# Patient Record
Sex: Female | Born: 1985 | Race: White | Hispanic: No | Marital: Married | Smoking: Former smoker
Health system: Southern US, Community
[De-identification: ages and names within clinical notes are randomized; demographics above are authoritative.]

## PROBLEM LIST (undated history)

## (undated) DIAGNOSIS — F319 Bipolar disorder, unspecified: Secondary | ICD-10-CM

## (undated) DIAGNOSIS — F411 Generalized anxiety disorder: Secondary | ICD-10-CM

## (undated) DIAGNOSIS — K589 Irritable bowel syndrome without diarrhea: Secondary | ICD-10-CM

## (undated) HISTORY — DX: Irritable bowel syndrome, unspecified: K58.9

## (undated) HISTORY — DX: Generalized anxiety disorder: F41.1

## (undated) HISTORY — PX: LAPAROSCOPY: SHX197

## (undated) HISTORY — DX: Bipolar disorder, unspecified: F31.9

## (undated) HISTORY — PX: CYSTOSCOPY WITH HYDRODISTENSION AND BIOPSY: SHX5127

## (undated) HISTORY — PX: CYSTOSCOPY: SUR368

---
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
## 2005-05-17 ENCOUNTER — Other Ambulatory Visit: Admission: RE | Admit: 2005-05-17 | Discharge: 2005-05-17 | Payer: Self-pay | Admitting: Obstetrics and Gynecology

## 2005-06-10 ENCOUNTER — Ambulatory Visit (HOSPITAL_COMMUNITY): Admission: RE | Admit: 2005-06-10 | Discharge: 2005-06-10 | Payer: Self-pay | Admitting: Obstetrics and Gynecology

## 2005-06-10 ENCOUNTER — Encounter (INDEPENDENT_AMBULATORY_CARE_PROVIDER_SITE_OTHER): Payer: Self-pay | Admitting: *Deleted

## 2006-06-30 ENCOUNTER — Other Ambulatory Visit: Admission: RE | Admit: 2006-06-30 | Discharge: 2006-06-30 | Payer: Self-pay | Admitting: Obstetrics and Gynecology

## 2006-10-28 ENCOUNTER — Ambulatory Visit: Payer: Self-pay | Admitting: Family Medicine

## 2006-10-28 DIAGNOSIS — F17200 Nicotine dependence, unspecified, uncomplicated: Secondary | ICD-10-CM | POA: Insufficient documentation

## 2006-10-28 DIAGNOSIS — G4700 Insomnia, unspecified: Secondary | ICD-10-CM | POA: Insufficient documentation

## 2006-10-28 DIAGNOSIS — F411 Generalized anxiety disorder: Secondary | ICD-10-CM | POA: Insufficient documentation

## 2006-12-02 ENCOUNTER — Ambulatory Visit: Payer: Self-pay | Admitting: Family Medicine

## 2007-02-03 ENCOUNTER — Ambulatory Visit: Payer: Self-pay | Admitting: Family Medicine

## 2007-02-03 DIAGNOSIS — IMO0001 Reserved for inherently not codable concepts without codable children: Secondary | ICD-10-CM | POA: Insufficient documentation

## 2007-02-04 ENCOUNTER — Encounter: Payer: Self-pay | Admitting: Family Medicine

## 2007-02-06 ENCOUNTER — Encounter: Payer: Self-pay | Admitting: Family Medicine

## 2007-02-06 LAB — CONVERTED CEMR LAB
AST: 18 units/L (ref 0–37)
Alkaline Phosphatase: 48 units/L (ref 39–117)
BUN: 9 mg/dL (ref 6–23)
Basophils Absolute: 0 10*3/uL (ref 0.0–0.1)
Basophils Relative: 0 % (ref 0–1)
Calcium: 9.3 mg/dL (ref 8.4–10.5)
Chloride: 107 meq/L (ref 96–112)
Creatinine, Ser: 0.59 mg/dL (ref 0.40–1.20)
Eosinophils Relative: 2 % (ref 0–5)
Folate: >20 ng/mL
HCT: 41.1 % (ref 36.0–46.0)
Hemoglobin: 13.1 g/dL (ref 12.0–15.0)
MCHC: 31.9 g/dL (ref 30.0–36.0)
Monocytes Absolute: 0.6 10*3/uL (ref 0.2–0.7)
RDW: 13.1 % (ref 11.5–14.0)
Total CK: 49 units/L (ref 7–177)
Vitamin B-12: 408 pg/mL (ref 211–911)

## 2007-02-17 ENCOUNTER — Ambulatory Visit: Payer: Self-pay | Admitting: Family Medicine

## 2007-03-14 ENCOUNTER — Ambulatory Visit: Payer: Self-pay | Admitting: Family Medicine

## 2007-03-15 ENCOUNTER — Encounter: Payer: Self-pay | Admitting: Family Medicine

## 2007-03-31 ENCOUNTER — Ambulatory Visit: Payer: Self-pay | Admitting: Family Medicine

## 2007-03-31 DIAGNOSIS — R109 Unspecified abdominal pain: Secondary | ICD-10-CM | POA: Insufficient documentation

## 2007-04-03 ENCOUNTER — Encounter: Payer: Self-pay | Admitting: Family Medicine

## 2007-05-04 ENCOUNTER — Ambulatory Visit: Payer: Self-pay | Admitting: Family Medicine

## 2007-05-04 DIAGNOSIS — K589 Irritable bowel syndrome without diarrhea: Secondary | ICD-10-CM | POA: Insufficient documentation

## 2007-05-10 ENCOUNTER — Telehealth: Payer: Self-pay | Admitting: Family Medicine

## 2007-05-22 ENCOUNTER — Encounter: Payer: Self-pay | Admitting: Family Medicine

## 2007-06-06 ENCOUNTER — Ambulatory Visit: Payer: Self-pay | Admitting: Family Medicine

## 2007-06-09 ENCOUNTER — Telehealth: Payer: Self-pay | Admitting: Family Medicine

## 2007-06-12 ENCOUNTER — Encounter: Payer: Self-pay | Admitting: Family Medicine

## 2007-06-26 ENCOUNTER — Telehealth: Payer: Self-pay | Admitting: Family Medicine

## 2007-06-27 ENCOUNTER — Telehealth: Payer: Self-pay | Admitting: Family Medicine

## 2007-06-29 ENCOUNTER — Encounter: Payer: Self-pay | Admitting: Family Medicine

## 2007-07-03 ENCOUNTER — Ambulatory Visit: Payer: Self-pay | Admitting: Family Medicine

## 2007-07-05 ENCOUNTER — Encounter: Payer: Self-pay | Admitting: Family Medicine

## 2007-07-19 ENCOUNTER — Telehealth: Payer: Self-pay | Admitting: Family Medicine

## 2007-08-31 HISTORY — PX: OTHER SURGICAL HISTORY: SHX169

## 2007-08-31 HISTORY — PX: CHOLECYSTECTOMY: SHX55

## 2007-10-03 ENCOUNTER — Ambulatory Visit: Payer: Self-pay | Admitting: Family Medicine

## 2007-10-09 ENCOUNTER — Telehealth: Payer: Self-pay | Admitting: Family Medicine

## 2007-10-23 ENCOUNTER — Telehealth: Payer: Self-pay | Admitting: Family Medicine

## 2007-12-18 ENCOUNTER — Ambulatory Visit: Payer: Self-pay | Admitting: Family Medicine

## 2007-12-18 DIAGNOSIS — R209 Unspecified disturbances of skin sensation: Secondary | ICD-10-CM | POA: Insufficient documentation

## 2007-12-19 LAB — CONVERTED CEMR LAB
Alkaline Phosphatase: 69 units/L (ref 39–117)
BUN: 9 mg/dL (ref 6–23)
Creatinine, Ser: 0.54 mg/dL (ref 0.40–1.20)
Glucose, Bld: 80 mg/dL (ref 70–99)
Sodium: 141 meq/L (ref 135–145)
Total Bilirubin: 0.5 mg/dL (ref 0.3–1.2)
Total Protein: 7.2 g/dL (ref 6.0–8.3)

## 2008-03-29 ENCOUNTER — Other Ambulatory Visit: Admission: RE | Admit: 2008-03-29 | Discharge: 2008-03-29 | Payer: Self-pay | Admitting: Obstetrics and Gynecology

## 2010-03-25 ENCOUNTER — Emergency Department (HOSPITAL_COMMUNITY): Admission: EM | Admit: 2010-03-25 | Discharge: 2010-03-26 | Payer: Self-pay | Admitting: Emergency Medicine

## 2010-11-14 LAB — COMPREHENSIVE METABOLIC PANEL
AST: 26 U/L (ref 0–37)
Albumin: 4.4 g/dL (ref 3.5–5.2)
Chloride: 107 mEq/L (ref 96–112)
Creatinine, Ser: 0.69 mg/dL (ref 0.4–1.2)
GFR calc Af Amer: >60 mL/min (ref >60)
Total Bilirubin: 0.4 mg/dL (ref 0.3–1.2)
Total Protein: 6.7 g/dL (ref 6.0–8.3)

## 2010-11-14 LAB — DIFFERENTIAL
Basophils Absolute: 0 10*3/uL (ref 0.0–0.1)
Eosinophils Relative: 2 % (ref 0–5)
Lymphocytes Relative: 23 % (ref 12–46)
Lymphs Abs: 1.5 10*3/uL (ref 0.7–4.0)
Monocytes Absolute: 0.7 10*3/uL (ref 0.1–1.0)
Monocytes Relative: 12 % (ref 3–12)

## 2010-11-14 LAB — URINALYSIS, ROUTINE W REFLEX MICROSCOPIC
Bilirubin Urine: NEGATIVE
Glucose, UA: NEGATIVE mg/dL
Hgb urine dipstick: NEGATIVE
Specific Gravity, Urine: 1.015 (ref 1.005–1.030)
pH: 6.5 (ref 5.0–8.0)

## 2010-11-14 LAB — CBC
HCT: 41.5 % (ref 36.0–46.0)
Hemoglobin: 14.4 g/dL (ref 12.0–15.0)
MCH: 33.9 pg (ref 26.0–34.0)
MCV: 97.4 fL (ref 78.0–100.0)
RBC: 4.26 MIL/uL (ref 3.87–5.11)
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
## 2011-01-15 NOTE — H&P (Signed)
[name], [name]             ACCOUNT NO.:  [id number]   MEDICAL RECORD NO.:  [id number]          PATIENT TYPE:  AMB   LOCATION:  SDC                           [facility]ILITY:  [facility]   PHYSICIAN:  Charles A. Delcambre, MDDATE OF BIRTH:  01-04-1986   DATE OF ADMISSION:  DATE OF DISCHARGE:                                HISTORY & PHYSICAL   ADDENDUM  THIS IS NOT THE H&P, THIS IS THE ADDENDUM.  There is no change to my H&P. She gives informed consent to diagnostic  laparoscopy, possible laser use, for pelvic pain. She accepts risks of  infection, bleeding, bowel and bladder damage, ureteral damage, blood  product risks including hepatitis and HIV exposure. Understands that cannot  guarantee 100% relief of pain or finding of etiology of pain, but  approximately 50% of the time we would have relief of her pain if we find a  negative laparoscopy. She did have negative GC and chlamydia cultures in the  office and negative Pap smear. Will get a preoperative CBC and urinalysis  was pending from the office as well. Pregnancy test will be obtained  preoperatively from serum and we will proceed as outlined. N.p.o. past  midnight. Surgical procedure scheduled for June 10, 2005.      Charles A. Sydnee Cabal, MD  Electronically Signed     CAD/MEDQ  D:  05/31/2005  T:  05/31/2005  Job:  [id number]

## 2011-01-15 NOTE — Discharge Summary (Signed)
[facility]  Patient:    [name], [name]                      MRN: [id number] Adm. Date:  [id number] Disc. Date: 06/21/00 Attending:  Milford Cage H                           Discharge Summary  REASON FOR ADMISSION:  This 25 year old white female was admitted on involuntary papers after writing a suicide note, stating that she planned to kill herself by cutting her wrists or using poison gas.  For further history of present illness, please see the patients psychiatric admission assessment.  PHYSICAL EXAMINATION:  At the time of admission was remarkable for superficial lacerations of the left wrist and was otherwise unremarkable.  LABORATORY EXAMINATION:  Patient underwent a laboratory workup to rule out any medical problems contributing to her symptomatology.  Urine drug screen was negative.  RPR was nonreactive.  Urine probe for gonorrhea and chlamydia was negative.  GGT was within normal limits.  Basic metabolic panel was within normal limits.  The CBC was unremarkable with the exception of an MCHC of 35.1.  Hepatic panel was within normal limits.  Urine pregnancy test was negative.  Patient received no additional consultations, and sustained no special procedures.  No x-rays were taken during the course of this hospitalization.  She sustained no complications during this hospital course.  HOSPITAL COURSE:  Patient rapidly adapted to unit routine, socializing well with both patients and staff.  She continued to show excessive reliance on borderline and histrionic defense mechanisms.  She was begun on a trial of Effexor XR, titrated up to a therapeutic dose, because she was continuing to display symptoms of attention deficit hyperactivity disorder and requested being changed to a long acting preparation from Ritalin.  She was begun on a trial of Concerta and that was also titrated up to a therapeutic dose.  At the time of discharge the patient  denies any homicidal or suicidal ideation.  Her affect and mood have improved.  Her hygiene has improved.  She is showing significant improvement in her activities of daily living.  Her psychomotor retardation has resolved.  She is participating in all aspects of the therapeutic treatment program and consequently is felt to have reached her maximum benefits of hospitalization and is ready for discharge to a less restrictive alternative setting.  CONDITION ON DISCHARGE:  Improved.  DIAGNOSES: Axis I:    1. Major depression, recurrent type, severe, without psychosis.            2. Generalized anxiety disorder.            3. Attention deficit hyperactivity disorder, combined type. Axis II:   Borderline histrionic traits. Axis III:  Superficial laceration to the left wrist. Axis IV:   Current psychosocial stressors are severe. Axis V:    Code 20 on admission, code 30 on discharge.  FURTHER EVALUATION AND TREATMENT RECOMMENDATIONS: 1. Patient is discharged to home. 2. The patient is discharged on unrestricted level of activity and a regular    diet. 3. The patient is discharged on Effexor XR 150 mg p.o. q.a.m., Concerta 36 mg    p.o. q.a.m.  FOLLOW-UP:  She will follow up with Dr. Queen Slough, for all further psychiatric treatment.  She will also continue to see her individual and family therapist in [place].  I will sign off on the case at  this time. DD:  06/21/00 TD:  06/21/00 Job: [id number]
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
## 2011-07-16 ENCOUNTER — Ambulatory Visit (INDEPENDENT_AMBULATORY_CARE_PROVIDER_SITE_OTHER): Payer: Medicaid Other | Admitting: Family

## 2011-07-16 ENCOUNTER — Encounter: Payer: Self-pay | Admitting: *Deleted

## 2011-07-16 ENCOUNTER — Other Ambulatory Visit (HOSPITAL_COMMUNITY)
Admission: RE | Admit: 2011-07-16 | Discharge: 2011-07-16 | Disposition: A | Discharge status: Home / Self Care | Payer: Medicaid Other | Source: Ambulatory Visit | Attending: Family | Admitting: Family

## 2011-07-16 VITALS — BP 107/68 | HR 91 | Temp 98.6°F | Resp 16 | Ht 68.0 in | Wt 137.0 lb

## 2011-07-16 DIAGNOSIS — Z113 Encounter for screening for infections with a predominantly sexual mode of transmission: Secondary | ICD-10-CM | POA: Insufficient documentation

## 2011-07-16 DIAGNOSIS — Z01419 Encounter for gynecological examination (general) (routine) without abnormal findings: Secondary | ICD-10-CM

## 2011-07-16 DIAGNOSIS — Z79899 Other long term (current) drug therapy: Secondary | ICD-10-CM

## 2011-07-16 DIAGNOSIS — Z1159 Encounter for screening for other viral diseases: Secondary | ICD-10-CM | POA: Insufficient documentation

## 2011-07-16 DIAGNOSIS — Z30431 Encounter for routine checking of intrauterine contraceptive device: Secondary | ICD-10-CM

## 2011-07-16 LAB — HIV ANTIBODY (ROUTINE TESTING W REFLEX): HIV: NONREACTIVE

## 2011-07-17 LAB — RPR

## 2011-07-17 NOTE — Progress Notes (Signed)
  Subjective:     [name] is a 25 y.o. female and is here for a comprehensive physical exam. The patient reports being here for Well Woman Exam.  Currently using Mirena for birth control, due for removal in February 2013. Pt would also like STD screening.  Denies abnormal vaginal discharge or lesions.   .  History   Social History  . Marital Status: Married    Spouse Name: N/A    Number of Children: N/A  . Years of Education: N/A   Occupational History  . student    Social History Main Topics  . Smoking status: Former Smoker -- 0.5 packs/day for 5 years    Types: Cigarettes    Quit date: 02/08/2011  . Smokeless tobacco: Never Used  . Alcohol Use: Yes     occassionally  . Drug Use: No  . Sexually Active: Yes -- Female partner(s)   Other Topics Concern  . Not on file   Social History Narrative  . No narrative on file   Health Maintenance  Topic Date Due  . Pap Smear  07/10/2002  . Tetanus/tdap  07/11/2003  . Influenza Vaccine  05/31/2011    The following portions of the patient's history were reviewed and updated as appropriate: allergies, current medications, past family history, past medical history, past social history, past surgical history and problem list.  Review of Systems Pertinent items are noted in HPI.   Objective:    BP 107/68  Pulse 91  Temp(Src) 98.6 F (37 C) (Oral)  Resp 16  Ht 5\' 8"  (1.727 m)  Wt 137 lb (62.143 kg)  BMI 20.83 kg/m2 General appearance: alert, cooperative and appears stated age Head: Normocephalic, without obvious abnormality, atraumatic Neck: no adenopathy, no carotid bruit, no JVD, supple, symmetrical, trachea midline and thyroid not enlarged, symmetric, no tenderness/mass/nodules Lungs: clear to auscultation bilaterally Breasts: normal appearance, no masses or tenderness, No nipple retraction or dimpling, No nipple discharge or bleeding, No axillary or supraclavicular adenopathy, Taught monthly breast self  examination Heart: regular rate and rhythm, S1, S2 normal, no murmur, click, rub or gallop Abdomen: soft, non-tender; bowel sounds normal; no masses,  no organomegaly Pelvic: cervix normal in appearance, external genitalia normal, no adnexal masses or tenderness, no cervical motion tenderness, rectovaginal septum normal, uterus normal size, shape, and consistency and vagina normal without discharge Extremities: extremities normal, atraumatic, no cyanosis or edema Skin: Skin color, texture, turgor normal. No rashes or lesions Lymph nodes: Cervical, supraclavicular, and axillary nodes normal.  IUD strings visualized; appropriate length; apparatus not seen at cervical os Assessment:    Healthy female exam.  STD Screen     Plan:  Pap smear to lab GC/CT, HIV, RPR, pt declined hepatitis panel Return in January for removal and insertion of Mirena.  [facility]

## 2011-11-09 ENCOUNTER — Ambulatory Visit (INDEPENDENT_AMBULATORY_CARE_PROVIDER_SITE_OTHER): Payer: Medicaid Other | Admitting: Obstetrics & Gynecology

## 2011-11-09 ENCOUNTER — Encounter: Payer: Self-pay | Admitting: Obstetrics & Gynecology

## 2011-11-09 VITALS — BP 111/68 | HR 88 | Temp 98.2°F | Resp 20 | Ht 68.0 in | Wt 137.0 lb

## 2011-11-09 DIAGNOSIS — Z30433 Encounter for removal and reinsertion of intrauterine contraceptive device: Secondary | ICD-10-CM

## 2011-11-09 DIAGNOSIS — Z975 Presence of (intrauterine) contraceptive device: Secondary | ICD-10-CM

## 2011-11-09 NOTE — Progress Notes (Signed)
  Subjective:    Patient ID: [name], female    DOB: [birth year], 26 y.o.   MRN: [id number]  HPI  [name] would like her old Mirena replaced with a new one.  Review of Systems Pap and labs normal     Objective:   Physical Exam  Mirena easily removed, cervix prepped with betadine, single tooth tenaculem grasped anterior lip of cervix, Mirena placed easily, strings cut to 3 cm. She tolerated procedure well.     Assessment & Plan:  RTC 1 year/prn sooner

## 2012-07-14 ENCOUNTER — Other Ambulatory Visit: Payer: Self-pay | Admitting: *Deleted

## 2012-07-14 ENCOUNTER — Other Ambulatory Visit: Payer: Self-pay | Admitting: Radiation Oncology

## 2012-07-14 NOTE — Telephone Encounter (Signed)
Faxed refill request back to pharmacy as "denied-patient is not in our practice". Try calling patient to determine who her PCP is.

## 2016-03-29 ENCOUNTER — Encounter (INDEPENDENT_AMBULATORY_CARE_PROVIDER_SITE_OTHER): Payer: Self-pay

## 2016-03-29 ENCOUNTER — Encounter: Payer: Self-pay | Admitting: Allergy and Immunology

## 2016-03-29 ENCOUNTER — Ambulatory Visit (INDEPENDENT_AMBULATORY_CARE_PROVIDER_SITE_OTHER): Payer: BLUE CROSS/BLUE SHIELD | Admitting: Allergy and Immunology

## 2016-03-29 VITALS — BP 108/60 | HR 70 | Temp 98.1°F | Ht 66.54 in | Wt 123.0 lb

## 2016-03-29 DIAGNOSIS — H1045 Other chronic allergic conjunctivitis: Secondary | ICD-10-CM | POA: Diagnosis not present

## 2016-03-29 DIAGNOSIS — J4520 Mild intermittent asthma, uncomplicated: Secondary | ICD-10-CM

## 2016-03-29 DIAGNOSIS — J3089 Other allergic rhinitis: Secondary | ICD-10-CM | POA: Insufficient documentation

## 2016-03-29 DIAGNOSIS — H1010 Acute atopic conjunctivitis, unspecified eye: Secondary | ICD-10-CM

## 2016-03-29 MED ORDER — LEVOCETIRIZINE DIHYDROCHLORIDE 5 MG PO TABS: 5.0000 mg | ORAL_TABLET | Freq: Every evening | ORAL | 5 refills | Status: AC

## 2016-03-29 MED ORDER — OLOPATADINE HCL 0.2 % OP SOLN: 1.0000 [drp] | OPHTHALMIC | 5 refills | Status: AC

## 2016-03-29 NOTE — Progress Notes (Signed)
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 female presenting today for consultation of rhinitis.  She reports that for many years she has experienced nasal congestion, rhinorrhea, postnasal drainage "always", occasional frontal sinus pressure, and occasional ocular pruritus.  These symptoms occur year around but her most prominent in the spring and summertime.  Specific triggers include exposure to pollens and dust.  She has tried fluticasone nasal spray, Afrin nasal spray, Claritin, Allegra, and Zyrtec.  She admits to occasional chest tightness and wheezing.  These lower respiratory symptoms are triggered by exercise and upper respiratory tract infections.  She has an albuterol rescue inhaler which she uses as needed.   Assessment and plan: Seasonal and perennial allergic rhinitis  Aeroallergen avoidance measures have been discussed and provided in written form.  A prescription has been provided for levocetirizine, 5 mg daily as needed.  A prescription has been provided for Dymista (azelastine/fluticasone) nasal spray, 1 spray per nostril twice daily as needed. Proper nasal spray technique has been discussed and demonstrated.  If allergen avoidance measures and medications fail to adequately relieve symptoms, aeroallergen immunotherapy will be considered.  Seasonal allergic conjunctivitis  Treatment plan as outlined above for allergic rhinitis.  A prescription has been provided for Pazeo, one drop per eye daily as needed.  Mild intermittent asthma  Continue albuterol HFA, 1-2 inhalations every 4-6 hours as needed and 15 minutes prior to  exercise.  Subjective and objective measures of pulmonary function will be followed and the treatment plan will be adjusted accordingly.   Meds ordered this encounter  Medications  . levocetirizine (XYZAL) 5 MG tablet    Sig: Take 1 tablet (5 mg total) by mouth every evening.    Dispense:  30 tablet    Refill:  5  . Olopatadine HCl (PATADAY) 0.2 % SOLN    Sig: Place 1 drop into both eyes 1 day or 1 dose.    Dispense:  1 Bottle    Refill:  5    Diagnositics: Spirometry: FVC was 3.89 L (102% predicted) and FEV1 was 2.80 L (87% predicted) without significant postbronchodilator improvement. Epicutaneous testing: Robust positive to grass pollens. Intradermal testing: Positive to weed pollens and molds.  Physical examination: Blood pressure 108/60, pulse 70, temperature 98.1 F (36.7 C), temperature source Oral, height 5' 6.53" (1.69 m), weight 123 lb 0.3 oz (55.8 kg).  General: Alert, interactive, in no acute distress. HEENT: TMs pearly gray, turbinates edematous with clear discharge, post-pharynx erythematous. Neck: Supple without lymphadenopathy. Lungs: Clear to auscultation without wheezing, rhonchi or rales. CV: Normal S1, S2 without murmurs. Abdomen: Nondistended, nontender. Skin: Warm and dry, without lesions or rashes. Extremities:  No clubbing, cyanosis or edema. Neuro:   Grossly intact.  Review of systems:  Review of Systems  Constitutional: Negative for chills, fever and weight loss.  HENT: Positive for congestion. Negative for nosebleeds.   Eyes: Negative for blurred vision.  Respiratory: Positive for shortness of breath and wheezing. Negative for hemoptysis.   Cardiovascular: Negative for chest pain.  Gastrointestinal: Negative for constipation and diarrhea.  Genitourinary: Negative for dysuria.  Musculoskeletal: Negative for joint pain and myalgias.  Skin: Negative for itching and rash.  Neurological: Positive for headaches.  Negative for dizziness.   Endo/Heme/Allergies: Positive for environmental allergies. Does not bruise/bleed easily.    Past medical history:  Past Medical History:  Diagnosis Date  . Bipolar 1 disorder (HCC)   . Bipolar 1 disorder, depressed (HCC)   . Endometriosis   . Endometriosis   . Generalized anxiety disorder   . IBS (irritable bowel syndrome)     Past surgical history:  Past Surgical History:  Procedure Laterality Date  . appendectomy  2009  . CHOLECYSTECTOMY  2009  . CYSTOSCOPY    . CYSTOSCOPY WITH HYDRODISTENSION AND BIOPSY    . LAPAROSCOPY     x 3  stage 2    Family history: Family History  Problem Relation Age of Onset  . Mitral valve prolapse Mother   . Depression Mother     anxiety  . Anxiety disorder Mother   . Thyroid disease Father   . Bipolar disorder Father   . Allergic rhinitis Father   . Stroke Maternal Grandmother   . Angioedema Neg Hx   . Asthma Neg Hx   . Eczema Neg Hx   . Urticaria Neg Hx     Social history: Social History   Social History  . Marital status: Married    Spouse name: N/A  . Number of children: N/A  . Years of education: N/A   Occupational History  . student    Social History Main Topics  . Smoking status: Former Smoker    Packs/day: 0.50    Years: 5.00    Types: Cigarettes    Quit date: 02/08/2011  . Smokeless tobacco: Never Used  . Alcohol use Yes     Comment: occassionally  . Drug use: No  . Sexual activity: Yes    Partners: Male    Birth control/ protection: IUD   Other Topics Concern  . Not on file   Social History Narrative  . No narrative on file   Environmental History: The patient lives in a 32 year old house with carpeting throughout and central air/heat.  There are 2 cats in the house.  The patient is a former cigarette smoker having quit in June 2012.  She has a 12-pack-year history.      Medication List       Accurate as of 03/29/16  6:46 PM. Always use your most recent med list.          AFRIN 12 HOUR 0.05 %  nasal spray Generic drug:  oxymetazoline Place 1 spray into both nostrils 2 (two) times daily.   buPROPion 300 MG 24 hr tablet Commonly known as:  WELLBUTRIN XL Take 300 mg by mouth at bedtime.   buPROPion 150 MG 24 hr tablet Commonly known as:  WELLBUTRIN XL Take 150 mg by mouth every morning.   cetirizine 10 MG tablet Commonly known as:  ZYRTEC Take 10 mg by mouth daily.   clonazePAM 0.5 MG tablet Commonly known as:  KLONOPIN Take 0.5 mg by mouth at bedtime.   dicyclomine 20 MG tablet Commonly known as:  BENTYL Take 20 mg by mouth daily as needed.   fluticasone 50 MCG/ACT nasal spray Commonly known as:  FLONASE Place 2 sprays into the nose daily as needed.   lamoTRIgine 200 MG tablet Commonly known as:  LAMICTAL Take 200 mg by mouth daily.   levocetirizine 5 MG tablet Commonly known as:  XYZAL Take 1 tablet (5 mg total) by mouth every evening.   levonorgestrel 20 MCG/24HR IUD Commonly known as:  MIRENA 1  each by Intrauterine route once.   naproxen sodium 220 MG tablet Commonly known as:  ANAPROX Take 220 mg by mouth 2 (two) times daily as needed.   Olopatadine HCl 0.2 % Soln Commonly known as:  PATADAY Place 1 drop into both eyes 1 day or 1 dose.   PROAIR HFA 108 (90 Base) MCG/ACT inhaler Generic drug:  albuterol Inhale 2 puffs into the lungs every 4 (four) hours as needed for wheezing or shortness of breath.   promethazine 25 MG tablet Commonly known as:  PHENERGAN Take 25 mg by mouth every 6 (six) hours as needed.   SEROQUEL XR 400 MG 24 hr tablet Generic drug:  QUEtiapine Take 400 mg by mouth at bedtime.       Known medication allergies: No Known Allergies  I appreciate the opportunity to take part in [name]'s care. Please do not hesitate to contact me with questions.  Sincerely,   R. Jorene Guest, MD

## 2016-03-29 NOTE — Patient Instructions (Addendum)
Seasonal and perennial allergic rhinitis  Aeroallergen avoidance measures have been discussed and provided in written form.  A prescription has been provided for levocetirizine, 5 mg daily as needed.  A prescription has been provided for Dymista (azelastine/fluticasone) nasal spray, 1 spray per nostril twice daily as needed. Proper nasal spray technique has been discussed and demonstrated.  If allergen avoidance measures and medications fail to adequately relieve symptoms, aeroallergen immunotherapy will be considered.  Seasonal allergic conjunctivitis  Treatment plan as outlined above for allergic rhinitis.  A prescription has been provided for Pazeo, one drop per eye daily as needed.  Mild intermittent asthma  Continue albuterol HFA, 1-2 inhalations every 4-6 hours as needed and 15 minutes prior to exercise.  Subjective and objective measures of pulmonary function will be followed and the treatment plan will be adjusted accordingly.   Return in about 6 months (around 09/29/2016), or if symptoms worsen or fail to improve.  Reducing Pollen Exposure  The American Academy of Allergy, Asthma and Immunology suggests the following steps to reduce your exposure to pollen during allergy seasons.    1. Do not hang sheets or clothing out to dry; pollen may collect on these items. 2. Do not mow lawns or spend time around freshly cut grass; mowing stirs up pollen. 3. Keep windows closed at night.  Keep car windows closed while driving. 4. Minimize morning activities outdoors, a time when pollen counts are usually at their highest. 5. Stay indoors as much as possible when pollen counts or humidity is high and on windy days when pollen tends to remain in the air longer. 6. Use air conditioning when possible.  Many air conditioners have filters that trap the pollen spores. 7. Use a HEPA room air filter to remove pollen form the indoor air you breathe.   Control of Mold Allergen  Mold and  fungi can grow on a variety of surfaces provided certain temperature and moisture conditions exist.  Outdoor molds grow on plants, decaying vegetation and soil.  The major outdoor mold, Alternaria and Cladosporium, are found in very high numbers during hot and dry conditions.  Generally, a late Summer - Fall peak is seen for common outdoor fungal spores.  Rain will temporarily lower outdoor mold spore count, but counts rise rapidly when the rainy period ends.  The most important indoor molds are Aspergillus and Penicillium.  Dark, humid and poorly ventilated basements are ideal sites for mold growth.  The next most common sites of mold growth are the bathroom and the kitchen.  Outdoor [employer] 1. Use air conditioning and keep windows closed 2. Avoid exposure to decaying vegetation. 3. Avoid leaf raking. 4. Avoid grain handling. 5. Consider wearing a face mask if working in moldy areas.  Indoor Mold Control 1. Maintain humidity below 50%. 2. Clean washable surfaces with 5% bleach solution. 3. Remove sources e.g. Contaminated carpets.

## 2016-03-29 NOTE — Assessment & Plan Note (Signed)
   Treatment plan as outlined above for allergic rhinitis.  A prescription has been provided for Pazeo, one drop per eye daily as needed. 

## 2016-03-29 NOTE — Assessment & Plan Note (Deleted)
   Continue albuterol HFA, 1-2 inhalations every 4-6 hours as needed and 15 minutes prior to exercise.  Subjective and objective measures of pulmonary function will be followed and the treatment plan will be adjusted accordingly. 

## 2016-03-29 NOTE — Assessment & Plan Note (Signed)
   Continue albuterol HFA, 1-2 inhalations every 4-6 hours as needed and 15 minutes prior to exercise.  Subjective and objective measures of pulmonary function will be followed and the treatment plan will be adjusted accordingly. 

## 2016-03-29 NOTE — Assessment & Plan Note (Signed)
   Aeroallergen avoidance measures have been discussed and provided in written form.  A prescription has been provided for levocetirizine, 5mg daily as needed.  A prescription has been provided for Dymista (azelastine/fluticasone) nasal spray, 1 spray per nostril twice daily as needed. Proper nasal spray technique has been discussed and demonstrated.  If allergen avoidance measures and medications fail to adequately relieve symptoms, aeroallergen immunotherapy will be considered. 

## 2016-08-04 ENCOUNTER — Encounter (HOSPITAL_COMMUNITY): Payer: Self-pay

## 2016-08-04 ENCOUNTER — Emergency Department (HOSPITAL_COMMUNITY)
Admission: EM | Admit: 2016-08-04 | Discharge: 2016-08-05 | Disposition: A | Discharge status: Home / Self Care | Payer: BLUE CROSS/BLUE SHIELD | Attending: Emergency Medicine | Admitting: Emergency Medicine

## 2016-08-04 ENCOUNTER — Emergency Department (HOSPITAL_COMMUNITY): Payer: BLUE CROSS/BLUE SHIELD

## 2016-08-04 DIAGNOSIS — Y999 Unspecified external cause status: Secondary | ICD-10-CM | POA: Diagnosis not present

## 2016-08-04 DIAGNOSIS — Y939 Activity, unspecified: Secondary | ICD-10-CM | POA: Insufficient documentation

## 2016-08-04 DIAGNOSIS — Z79899 Other long term (current) drug therapy: Secondary | ICD-10-CM | POA: Diagnosis not present

## 2016-08-04 DIAGNOSIS — Z87891 Personal history of nicotine dependence: Secondary | ICD-10-CM | POA: Diagnosis not present

## 2016-08-04 DIAGNOSIS — J45909 Unspecified asthma, uncomplicated: Secondary | ICD-10-CM | POA: Insufficient documentation

## 2016-08-04 DIAGNOSIS — Z23 Encounter for immunization: Secondary | ICD-10-CM | POA: Insufficient documentation

## 2016-08-04 DIAGNOSIS — S70311A Abrasion, right thigh, initial encounter: Secondary | ICD-10-CM | POA: Insufficient documentation

## 2016-08-04 DIAGNOSIS — Y92410 Unspecified street and highway as the place of occurrence of the external cause: Secondary | ICD-10-CM | POA: Diagnosis not present

## 2016-08-04 DIAGNOSIS — S70312A Abrasion, left thigh, initial encounter: Secondary | ICD-10-CM | POA: Insufficient documentation

## 2016-08-04 DIAGNOSIS — T07XXXA Unspecified multiple injuries, initial encounter: Secondary | ICD-10-CM

## 2016-08-04 DIAGNOSIS — S79921A Unspecified injury of right thigh, initial encounter: Secondary | ICD-10-CM | POA: Diagnosis present

## 2016-08-04 LAB — URINALYSIS, ROUTINE W REFLEX MICROSCOPIC
BILIRUBIN URINE: NEGATIVE
Glucose, UA: NEGATIVE mg/dL
KETONES UR: 5 mg/dL — AB
Nitrite: NEGATIVE
PROTEIN: 100 mg/dL — AB
Specific Gravity, Urine: 1.026 (ref 1.005–1.030)
pH: 6 (ref 5.0–8.0)

## 2016-08-04 LAB — CBC
HCT: 43.1 % (ref 36.0–46.0)
HEMOGLOBIN: 14.5 g/dL (ref 12.0–15.0)
MCH: 31.9 pg (ref 26.0–34.0)
MCHC: 33.6 g/dL (ref 30.0–36.0)
MCV: 94.9 fL (ref 78.0–100.0)
Platelets: 289 10*3/uL (ref 150–400)
RBC: 4.54 MIL/uL (ref 3.87–5.11)
RDW: 12.7 % (ref 11.5–15.5)
WBC: 9.2 10*3/uL (ref 4.0–10.5)
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
## 2016-08-05 ENCOUNTER — Encounter (HOSPITAL_COMMUNITY): Payer: Self-pay | Admitting: Emergency Medicine

## 2016-08-05 ENCOUNTER — Emergency Department (HOSPITAL_COMMUNITY): Payer: BLUE CROSS/BLUE SHIELD

## 2016-08-05 DIAGNOSIS — S70311A Abrasion, right thigh, initial encounter: Secondary | ICD-10-CM | POA: Diagnosis not present

## 2016-08-05 LAB — COMPREHENSIVE METABOLIC PANEL
ALBUMIN: 5.2 g/dL — AB (ref 3.5–5.0)
ALK PHOS: 63 U/L (ref 38–126)
ALT: 22 U/L (ref 14–54)
ANION GAP: 8 (ref 5–15)
AST: 31 U/L (ref 15–41)
BILIRUBIN TOTAL: 0.7 mg/dL (ref 0.3–1.2)
BUN: 13 mg/dL (ref 6–20)
CALCIUM: 9.3 mg/dL (ref 8.9–10.3)
CO2: 27 mmol/L (ref 22–32)
Chloride: 105 mmol/L (ref 101–111)
Creatinine, Ser: 0.76 mg/dL (ref 0.44–1.00)
GFR calc Af Amer: >60 mL/min (ref >60)
GFR calc non Af Amer: >60 mL/min (ref >60)
GLUCOSE: 86 mg/dL (ref 65–99)
Potassium: 3.3 mmol/L — ABNORMAL LOW (ref 3.5–5.1)
Sodium: 140 mmol/L (ref 135–145)
TOTAL PROTEIN: 7.8 g/dL (ref 6.5–8.1)

## 2016-08-05 LAB — LIPASE, BLOOD: Lipase: 25 U/L (ref 11–51)

## 2016-08-05 MED ORDER — NAPROXEN 500 MG PO TABS
500.0000 mg | ORAL_TABLET | Freq: Once | ORAL | Status: AC
  Administered 2016-08-05: 500 mg via ORAL
  Filled 2016-08-05: qty 1

## 2016-08-05 MED ORDER — TETANUS-DIPHTH-ACELL PERTUSSIS 5-2.5-18.5 LF-MCG/0.5 IM SUSP
0.5000 mL | Freq: Once | INTRAMUSCULAR | Status: AC
  Administered 2016-08-05: 0.5 mL via INTRAMUSCULAR
  Filled 2016-08-05: qty 0.5

## 2016-08-05 MED ORDER — NAPROXEN 375 MG PO TABS: 375.0000 mg | ORAL_TABLET | Freq: Two times a day (BID) | ORAL | 0 refills | Status: AC

## 2016-08-05 MED ORDER — METHOCARBAMOL 500 MG PO TABS: 500.0000 mg | ORAL_TABLET | Freq: Two times a day (BID) | ORAL | 0 refills | Status: AC

## 2016-08-05 MED ORDER — ACETAMINOPHEN 500 MG PO TABS
1000.0000 mg | ORAL_TABLET | Freq: Once | ORAL | Status: AC
  Administered 2016-08-05: 1000 mg via ORAL
  Filled 2016-08-05: qty 2

## 2016-08-05 MED ORDER — METHOCARBAMOL 500 MG PO TABS
1000.0000 mg | ORAL_TABLET | Freq: Once | ORAL | Status: AC
  Administered 2016-08-05: 1000 mg via ORAL
  Filled 2016-08-05: qty 2

## 2016-08-05 NOTE — ED Notes (Signed)
ED Provider at bedside. 
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
